# Patient Record
Sex: Male | Born: 1945 | Hispanic: No | Marital: Married
Health system: Southern US, Community
[De-identification: ages and names within clinical notes are randomized; demographics above are authoritative.]

---

## 2004-05-31 ENCOUNTER — Emergency Department: Payer: Self-pay | Admitting: Internal Medicine

## 2009-07-15 ENCOUNTER — Ambulatory Visit: Payer: Self-pay | Admitting: Cardiovascular Disease

## 2009-07-15 ENCOUNTER — Emergency Department: Payer: Self-pay | Admitting: Emergency Medicine

## 2009-07-22 ENCOUNTER — Ambulatory Visit: Payer: Self-pay | Admitting: Cardiovascular Disease

## 2009-07-22 DIAGNOSIS — R079 Chest pain, unspecified: Secondary | ICD-10-CM | POA: Insufficient documentation

## 2009-07-22 DIAGNOSIS — R9431 Abnormal electrocardiogram [ECG] [EKG]: Secondary | ICD-10-CM

## 2010-06-22 NOTE — Miscellaneous (Signed)
Summary: MV order  Clinical Lists Changes  Problems: Added new problem of CHEST PAIN UNSPECIFIED (ICD-786.50) Added new problem of ELECTROCARDIOGRAM, ABNORMAL (ICD-794.31) Orders: Added new Referral order of Nuclear Stress Test (Nuc Stress Test) - Signed

## 2011-03-01 ENCOUNTER — Emergency Department: Payer: Self-pay | Admitting: Internal Medicine

## 2011-11-21 ENCOUNTER — Ambulatory Visit: Payer: Self-pay | Admitting: Radiation Oncology

## 2011-12-22 ENCOUNTER — Ambulatory Visit: Payer: Self-pay | Admitting: Radiation Oncology

## 2012-01-22 ENCOUNTER — Ambulatory Visit: Payer: Self-pay | Admitting: Radiation Oncology

## 2012-02-07 LAB — CBC CANCER CENTER
Basophil %: 1 %
Eosinophil %: 2.1 %
HCT: 39.9 % — ABNORMAL LOW (ref 40.0–52.0)
HGB: 13.1 g/dL (ref 13.0–18.0)
Lymphocyte %: 19.8 %
MCHC: 32.9 g/dL (ref 32.0–36.0)
Monocyte %: 18.5 %
Neutrophil #: 2 x10 3/mm (ref 1.4–6.5)
Neutrophil %: 58.6 %
RBC: 4.35 10*6/uL — ABNORMAL LOW (ref 4.40–5.90)

## 2012-02-13 LAB — CBC CANCER CENTER
Basophil #: 0 x10 3/mm (ref 0.0–0.1)
Eosinophil #: 0.2 x10 3/mm (ref 0.0–0.7)
MCH: 30.3 pg (ref 26.0–34.0)
MCHC: 33.3 g/dL (ref 32.0–36.0)
Monocyte %: 15.6 %
Neutrophil %: 67.2 %
Platelet: 204 x10 3/mm (ref 150–440)
RDW: 13.1 % (ref 11.5–14.5)

## 2012-02-20 LAB — CBC CANCER CENTER
Eosinophil %: 6 %
HGB: 12.4 g/dL — ABNORMAL LOW (ref 13.0–18.0)
Monocyte %: 14.4 %
Neutrophil %: 63.4 %
Platelet: 204 x10 3/mm (ref 150–440)
RBC: 4.1 10*6/uL — ABNORMAL LOW (ref 4.40–5.90)
WBC: 3.1 x10 3/mm — ABNORMAL LOW (ref 3.8–10.6)

## 2012-02-21 ENCOUNTER — Ambulatory Visit: Payer: Self-pay | Admitting: Radiation Oncology

## 2012-02-27 LAB — CBC CANCER CENTER
Basophil #: 0 x10 3/mm (ref 0.0–0.1)
Eosinophil #: 0.3 x10 3/mm (ref 0.0–0.7)
Eosinophil %: 6.1 %
HCT: 36.5 % — ABNORMAL LOW (ref 40.0–52.0)
HGB: 12 g/dL — ABNORMAL LOW (ref 13.0–18.0)
MCH: 30.4 pg (ref 26.0–34.0)
MCV: 93 fL (ref 80–100)
Monocyte #: 0.6 x10 3/mm (ref 0.2–1.0)
Neutrophil %: 73.7 %
Platelet: 213 x10 3/mm (ref 150–440)
RDW: 13.9 % (ref 11.5–14.5)

## 2012-03-06 LAB — CBC CANCER CENTER
Eosinophil %: 8.1 %
HCT: 38.1 % — ABNORMAL LOW (ref 40.0–52.0)
HGB: 12.6 g/dL — ABNORMAL LOW (ref 13.0–18.0)
Lymphocyte #: 0.3 x10 3/mm — ABNORMAL LOW (ref 1.0–3.6)
Lymphocyte %: 9.4 %
MCH: 30.9 pg (ref 26.0–34.0)
MCV: 93 fL (ref 80–100)
Monocyte %: 14.2 %
Neutrophil #: 2 x10 3/mm (ref 1.4–6.5)
Platelet: 222 x10 3/mm (ref 150–440)
RBC: 4.08 10*6/uL — ABNORMAL LOW (ref 4.40–5.90)
WBC: 3 x10 3/mm — ABNORMAL LOW (ref 3.8–10.6)

## 2012-03-12 LAB — CBC CANCER CENTER
Basophil %: 0.6 %
Eosinophil %: 5.4 %
HGB: 13.1 g/dL (ref 13.0–18.0)
Lymphocyte #: 0.2 x10 3/mm — ABNORMAL LOW (ref 1.0–3.6)
MCH: 31.1 pg (ref 26.0–34.0)
MCV: 95 fL (ref 80–100)
Monocyte #: 0.5 x10 3/mm (ref 0.2–1.0)
RBC: 4.21 10*6/uL — ABNORMAL LOW (ref 4.40–5.90)
WBC: 3.7 x10 3/mm — ABNORMAL LOW (ref 3.8–10.6)

## 2012-03-19 LAB — CBC CANCER CENTER
Basophil #: 0 x10 3/mm (ref 0.0–0.1)
Eosinophil %: 4.9 %
HCT: 37.6 % — ABNORMAL LOW (ref 40.0–52.0)
HGB: 12.2 g/dL — ABNORMAL LOW (ref 13.0–18.0)
Lymphocyte #: 0.2 x10 3/mm — ABNORMAL LOW (ref 1.0–3.6)
Lymphocyte %: 7.6 %
MCV: 95 fL (ref 80–100)
Monocyte %: 15.1 %
Neutrophil #: 1.9 x10 3/mm (ref 1.4–6.5)
RBC: 3.98 10*6/uL — ABNORMAL LOW (ref 4.40–5.90)
RDW: 13.8 % (ref 11.5–14.5)
WBC: 2.7 x10 3/mm — ABNORMAL LOW (ref 3.8–10.6)

## 2012-03-23 ENCOUNTER — Ambulatory Visit: Payer: Self-pay | Admitting: Radiation Oncology

## 2012-03-26 LAB — CBC CANCER CENTER
Basophil #: 0 x10 3/mm (ref 0.0–0.1)
HCT: 38.5 % — ABNORMAL LOW (ref 40.0–52.0)
Lymphocyte #: 0.2 x10 3/mm — ABNORMAL LOW (ref 1.0–3.6)
MCH: 31.1 pg (ref 26.0–34.0)
MCHC: 32.8 g/dL (ref 32.0–36.0)
MCV: 95 fL (ref 80–100)
Monocyte #: 0.4 x10 3/mm (ref 0.2–1.0)
Monocyte %: 12.1 %
Neutrophil #: 2.8 x10 3/mm (ref 1.4–6.5)
Platelet: 238 x10 3/mm (ref 150–440)
RDW: 13.8 % (ref 11.5–14.5)
WBC: 3.6 x10 3/mm — ABNORMAL LOW (ref 3.8–10.6)

## 2012-04-22 ENCOUNTER — Ambulatory Visit: Payer: Self-pay | Admitting: Radiation Oncology

## 2012-05-23 ENCOUNTER — Ambulatory Visit: Payer: Self-pay | Admitting: Radiation Oncology

## 2012-08-27 ENCOUNTER — Ambulatory Visit: Payer: Self-pay | Admitting: Radiation Oncology

## 2012-09-20 ENCOUNTER — Ambulatory Visit: Payer: Self-pay | Admitting: Radiation Oncology

## 2013-02-26 ENCOUNTER — Ambulatory Visit: Payer: Self-pay | Admitting: Radiation Oncology

## 2013-02-27 ENCOUNTER — Ambulatory Visit: Payer: Self-pay | Admitting: Radiation Oncology

## 2013-02-28 LAB — PSA: PSA: 0.7 ng/mL (ref 0.0–4.0)

## 2013-03-22 ENCOUNTER — Inpatient Hospital Stay: Payer: Self-pay | Admitting: Internal Medicine

## 2013-03-22 LAB — PROTIME-INR
INR: 1
Prothrombin Time: 13.8 secs (ref 11.5–14.7)

## 2013-03-22 LAB — URINALYSIS, COMPLETE
Bacteria: NONE SEEN
Bacteria: NONE SEEN
Bilirubin,UR: NEGATIVE
Glucose,UR: NEGATIVE mg/dL (ref 0–75)
Hyaline Cast: 9
Ketone: NEGATIVE
Leukocyte Esterase: NEGATIVE
Leukocyte Esterase: NEGATIVE
Nitrite: NEGATIVE
Ph: 5 (ref 4.5–8.0)
Protein: 30
RBC,UR: 5 /HPF (ref 0–5)
RBC,UR: 2 /HPF (ref 0–5)
Specific Gravity: 1.021 (ref 1.003–1.030)
Specific Gravity: 1.046 (ref 1.003–1.030)
Squamous Epithelial: 2
Squamous Epithelial: 1
WBC UR: 6 /HPF (ref 0–5)
WBC UR: 1 /HPF (ref 0–5)

## 2013-03-22 LAB — COMPREHENSIVE METABOLIC PANEL
Alkaline Phosphatase: 255 U/L — ABNORMAL HIGH (ref 50–136)
BUN: 31 mg/dL — ABNORMAL HIGH (ref 7–18)
Bilirubin,Total: 1.5 mg/dL — ABNORMAL HIGH (ref 0.2–1.0)
Calcium, Total: 9.2 mg/dL (ref 8.5–10.1)
Creatinine: 1.27 mg/dL (ref 0.60–1.30)
EGFR (Non-African Amer.): 57 — ABNORMAL LOW
Glucose: 144 mg/dL — ABNORMAL HIGH (ref 65–99)
Osmolality: 279 (ref 275–301)
Potassium: 4.5 mmol/L (ref 3.5–5.1)
SGOT(AST): 108 U/L — ABNORMAL HIGH (ref 15–37)
SGPT (ALT): 42 U/L (ref 12–78)
Sodium: 135 mmol/L — ABNORMAL LOW (ref 136–145)

## 2013-03-22 LAB — DIFFERENTIAL
Bands: 3 %
Lymphocytes: 32 %
Monocytes: 10 %
Segmented Neutrophils: 55 %

## 2013-03-22 LAB — CBC
HGB: 6.4 g/dL — ABNORMAL LOW (ref 13.0–18.0)
MCHC: 34.9 g/dL (ref 32.0–36.0)
MCV: 86 fL (ref 80–100)
Platelet: 30 10*3/uL — CL (ref 150–440)
RBC: 2.14 10*6/uL — ABNORMAL LOW (ref 4.40–5.90)

## 2013-03-23 ENCOUNTER — Ambulatory Visit: Payer: Self-pay | Admitting: Radiation Oncology

## 2013-03-23 LAB — MAGNESIUM: Magnesium: 1.6 mg/dL — ABNORMAL LOW

## 2013-03-23 LAB — CBC WITH DIFFERENTIAL/PLATELET
HGB: 7.2 g/dL — ABNORMAL LOW (ref 13.0–18.0)
Lymphocytes: 37 %
MCH: 29.3 pg (ref 26.0–34.0)
MCV: 83 fL (ref 80–100)
Monocytes: 4 %
RDW: 14.9 % — ABNORMAL HIGH (ref 11.5–14.5)
Segmented Neutrophils: 59 %
WBC: 0.6 10*3/uL — CL (ref 3.8–10.6)

## 2013-03-23 LAB — LIPID PANEL
HDL Cholesterol: 12 mg/dL — ABNORMAL LOW (ref 40–60)
Ldl Cholesterol, Calc: 56 mg/dL (ref 0–100)
Triglycerides: 284 mg/dL — ABNORMAL HIGH (ref 0–200)
VLDL Cholesterol, Calc: 57 mg/dL — ABNORMAL HIGH (ref 5–40)

## 2013-03-23 LAB — COMPREHENSIVE METABOLIC PANEL
BUN: 26 mg/dL — ABNORMAL HIGH (ref 7–18)
Chloride: 107 mmol/L (ref 98–107)
Co2: 24 mmol/L (ref 21–32)
EGFR (African American): >60
Total Protein: 5.1 g/dL — ABNORMAL LOW (ref 6.4–8.2)

## 2013-03-23 LAB — HEMOGLOBIN: HGB: 7.6 g/dL — ABNORMAL LOW (ref 13.0–18.0)

## 2013-03-24 LAB — CBC WITH DIFFERENTIAL/PLATELET
Basophil #: 0 10*3/uL (ref 0.0–0.1)
Basophil %: 1 %
HCT: 20.6 % — ABNORMAL LOW (ref 40.0–52.0)
HGB: 7.6 g/dL — ABNORMAL LOW (ref 13.0–18.0)
Lymphocyte #: 0.1 10*3/uL — ABNORMAL LOW (ref 1.0–3.6)
Lymphocyte %: 39.2 %
MCH: 30.4 pg (ref 26.0–34.0)
Monocyte #: 0 x10 3/mm — ABNORMAL LOW (ref 0.2–1.0)
Monocyte %: 12.4 %
Neutrophil #: 0.2 10*3/uL — ABNORMAL LOW (ref 1.4–6.5)
Neutrophil %: 46.9 %
Platelet: 18 10*3/uL — CL (ref 150–440)
RDW: 14.9 % — ABNORMAL HIGH (ref 11.5–14.5)
WBC: 0.4 10*3/uL — CL (ref 3.8–10.6)

## 2013-03-24 LAB — HEMOGLOBIN: HGB: 7.7 g/dL — ABNORMAL LOW (ref 13.0–18.0)

## 2013-03-24 LAB — MAGNESIUM: Magnesium: 1.8 mg/dL

## 2013-03-24 LAB — PLATELET COUNT: Platelet: 18 10*3/uL — CL (ref 150–440)

## 2013-03-25 LAB — CBC WITH DIFFERENTIAL/PLATELET
Basophil #: 0 10*3/uL (ref 0.0–0.1)
Eosinophil #: 0 10*3/uL (ref 0.0–0.7)
Eosinophil %: 0.3 %
HCT: 18.9 % — ABNORMAL LOW (ref 40.0–52.0)
HGB: 6.9 g/dL — ABNORMAL LOW (ref 13.0–18.0)
Lymphocytes: 19 %
MCH: 29.9 pg (ref 26.0–34.0)
MCV: 82 fL (ref 80–100)
Monocyte %: 9.4 %
Monocytes: 9 %
Segmented Neutrophils: 61 %

## 2013-03-25 LAB — VANCOMYCIN, TROUGH: Vancomycin, Trough: 30 ug/mL (ref 10–20)

## 2013-03-25 LAB — CREATININE, SERUM: EGFR (African American): >60

## 2013-03-26 LAB — CBC WITH DIFFERENTIAL/PLATELET
Basophil #: 0 10*3/uL (ref 0.0–0.1)
Basophil %: 1.3 %
Eosinophil %: 0.9 %
HCT: 22.9 % — ABNORMAL LOW (ref 40.0–52.0)
Lymphocyte %: 33.2 %
MCH: 29.7 pg (ref 26.0–34.0)
Monocyte %: 10.7 %
Neutrophil #: 0.4 10*3/uL — ABNORMAL LOW (ref 1.4–6.5)
Neutrophil %: 53.9 %
Platelet: 24 10*3/uL — CL (ref 150–440)
RDW: 15.1 % — ABNORMAL HIGH (ref 11.5–14.5)
WBC: 0.7 10*3/uL — CL (ref 3.8–10.6)

## 2013-03-26 LAB — BASIC METABOLIC PANEL
Anion Gap: 6 — ABNORMAL LOW (ref 7–16)
Calcium, Total: 9.1 mg/dL (ref 8.5–10.1)
Chloride: 102 mmol/L (ref 98–107)
Co2: 25 mmol/L (ref 21–32)
Creatinine: 1.16 mg/dL (ref 0.60–1.30)
EGFR (African American): >60
Glucose: 86 mg/dL (ref 65–99)
Osmolality: 273 (ref 275–301)
Potassium: 4.2 mmol/L (ref 3.5–5.1)
Sodium: 133 mmol/L — ABNORMAL LOW (ref 136–145)

## 2013-03-27 LAB — CULTURE, BLOOD (SINGLE)

## 2013-03-27 LAB — CBC WITH DIFFERENTIAL/PLATELET
Basophil #: 0 10*3/uL (ref 0.0–0.1)
Eosinophil #: 0 10*3/uL (ref 0.0–0.7)
Eosinophil %: 1.1 %
HCT: 22 % — ABNORMAL LOW (ref 40.0–52.0)
Lymphocyte #: 0.2 10*3/uL — ABNORMAL LOW (ref 1.0–3.6)
Lymphocyte %: 38.2 %
Monocyte #: 0.1 x10 3/mm — ABNORMAL LOW (ref 0.2–1.0)
Monocyte %: 10.4 %
Neutrophil #: 0.2 10*3/uL — ABNORMAL LOW (ref 1.4–6.5)
Platelet: 13 10*3/uL — CL (ref 150–440)
RBC: 2.68 10*6/uL — ABNORMAL LOW (ref 4.40–5.90)
WBC: 0.5 10*3/uL — CL (ref 3.8–10.6)

## 2013-03-29 ENCOUNTER — Ambulatory Visit: Payer: Self-pay | Admitting: Radiation Oncology

## 2013-04-22 DEATH — deceased

## 2013-08-27 ENCOUNTER — Ambulatory Visit: Payer: Self-pay | Admitting: Radiation Oncology

## 2014-03-23 IMAGING — CR DG CHEST 2V
1 series · 2 of 2 positions shown · non-contrast
Comparison: none

<!--  IDXRADR:ADDEND:BEGIN -->Addendum Begins
REASON FOR EXAM: shortness of breath
COMMENTS:

PROCEDURE:     DXR - DXR CHEST PA (OR AP) AND LATERAL  - March 22, 2013  [DATE]
RESULT:

[Series 1: pa · 0.17mm/px · 2 of 2 slices shown]
[im 1/2]
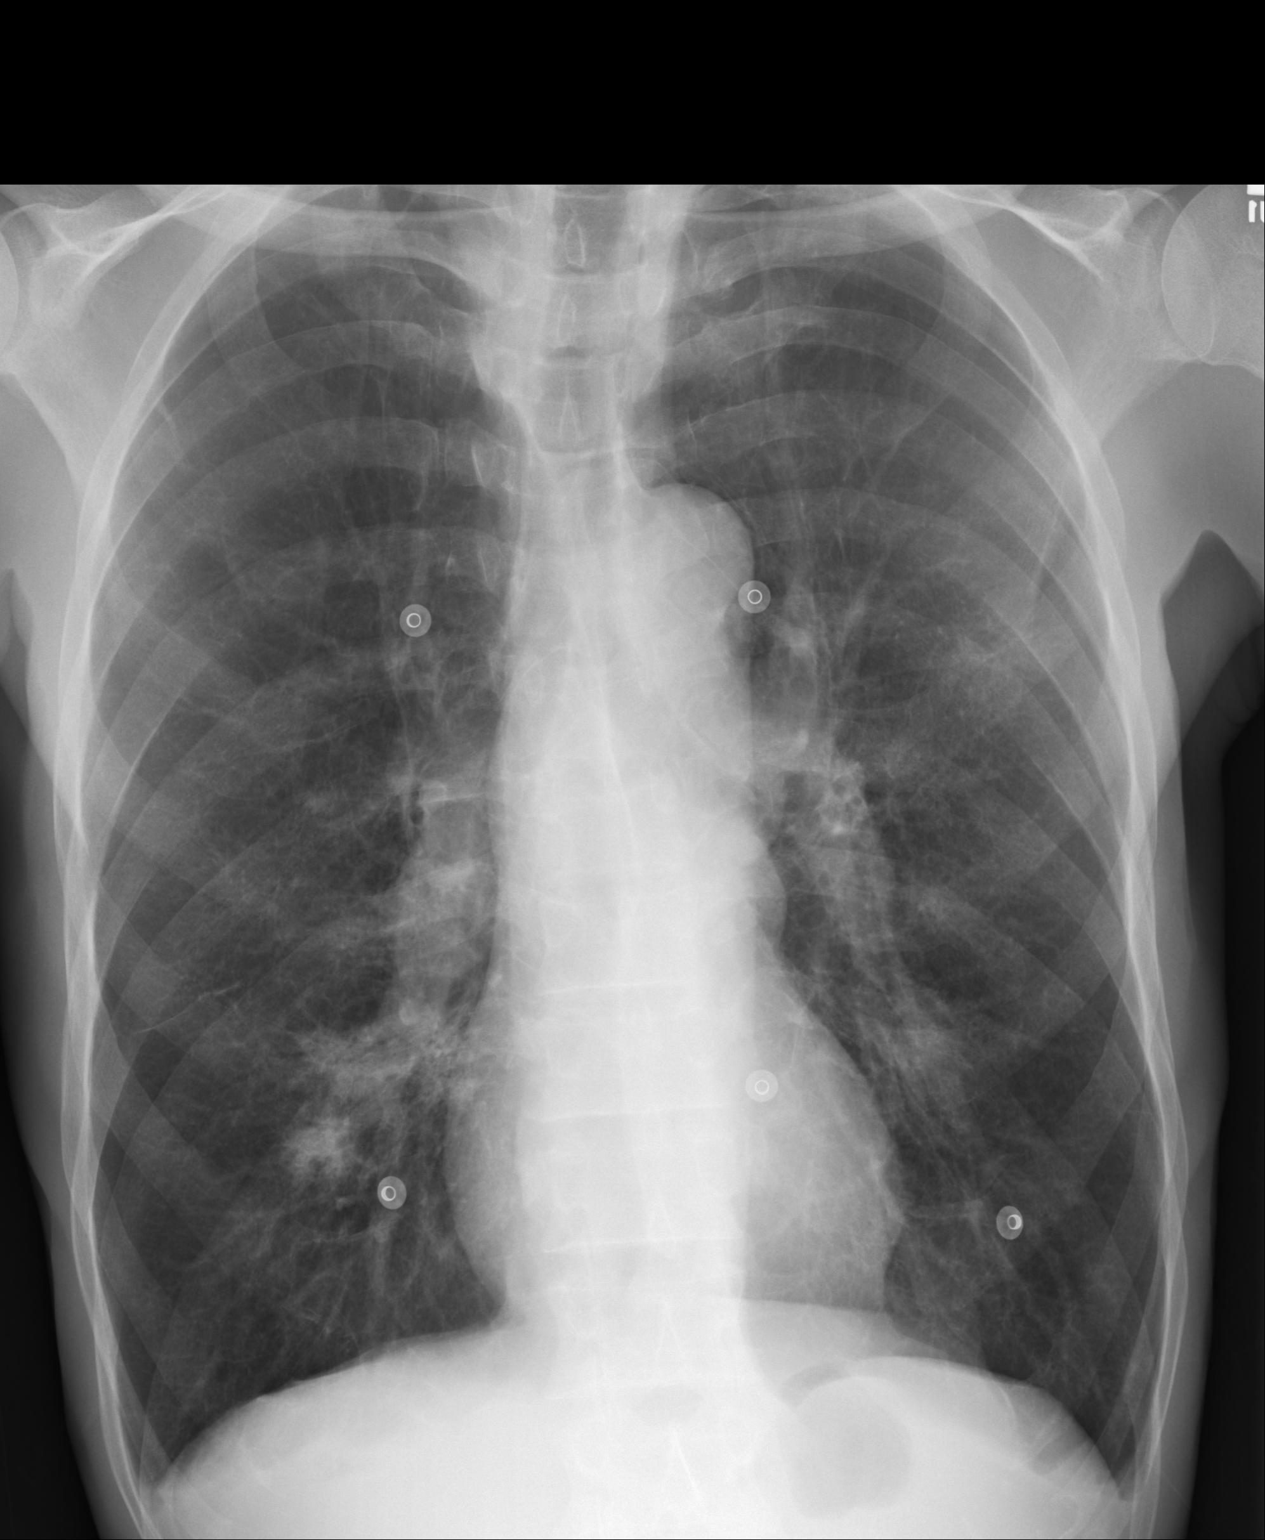
[im 2/2]
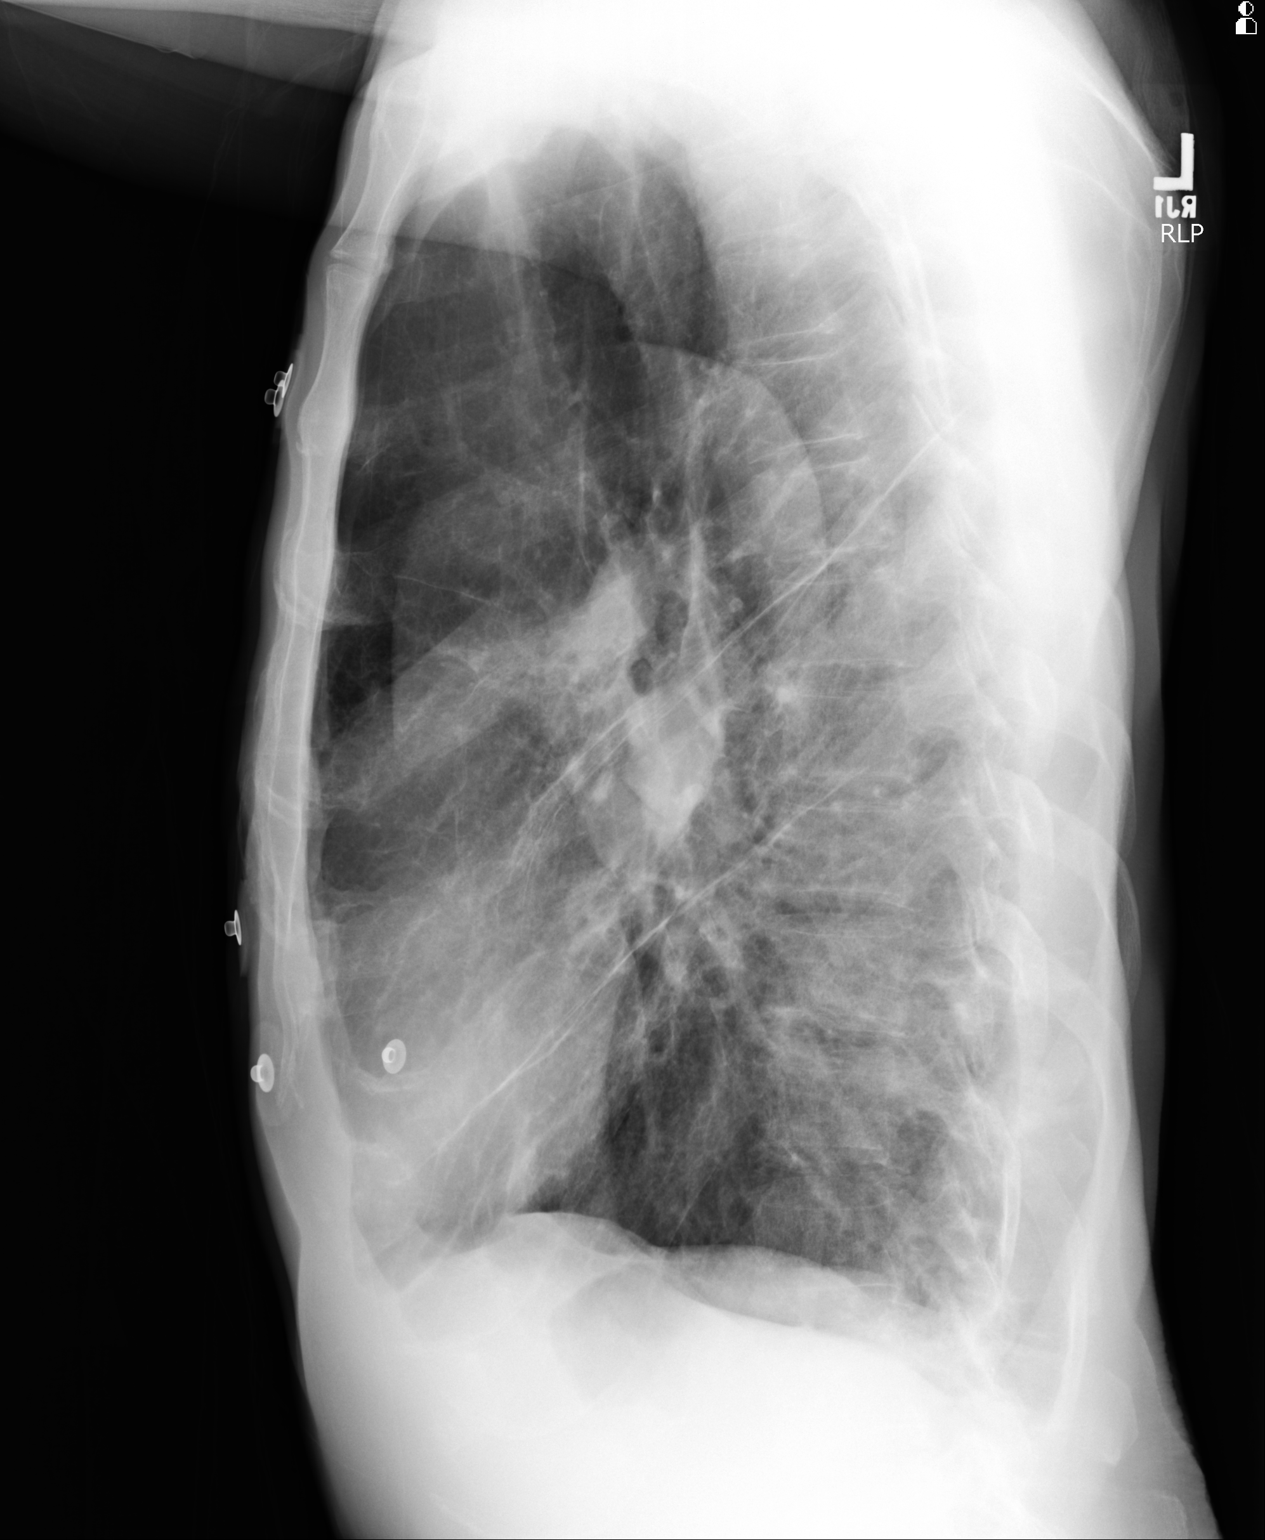

[2 of 2 positions shown; findings below may reference images not displayed]

FINDINGS: The lungs are hyperinflated. Spiculated area of increased density
projects within the right middle lobe region. This finding is appreciated
primarily on the frontal view. This finding appears to have developed in the
interim. Further evaluation with contrasted chest CT is recommended. The
cardiac silhouette and visualized bony skeleton is unremarkable.
IMPRESSION: 1.  COPD.
2.  Findings concerning for a spiculated nodule within the right middle
lobe.
3.  No further evidence of focal or acute infiltrates or consolidation.

<!--  IDXRADR:ADDEND:INNER_END -->Addendum Ends
<!--  IDXRADR:ADDEND:END -->

## 2014-03-23 IMAGING — CT CT CHEST-ABD-PELV W/ CM
3 of 4 series · 13 of 32 positions shown, 18 images · IV contrast (APPLIED)
Comparison: none

REASON FOR EXAM: (1) shortness of breath new lesions on cxr. eval PE; (2)
abd pain early satiety
COMMENTS:

[Series 6: soft tissue · axial · 0.74mm/px · z∈[-244,-124]mm · 2 of 120 slices shown]
[im 40/120  soft-tissue]
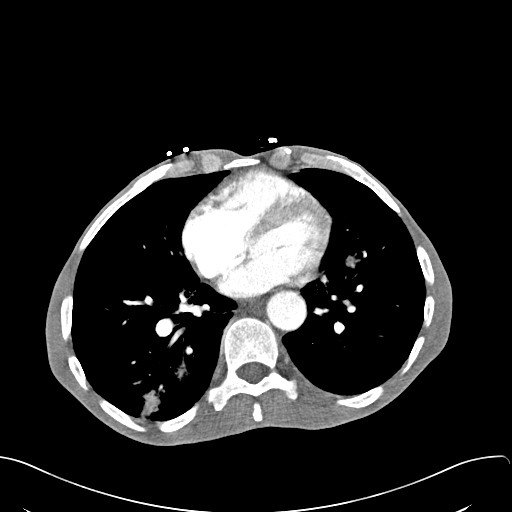
[im 80/120  soft-tissue]
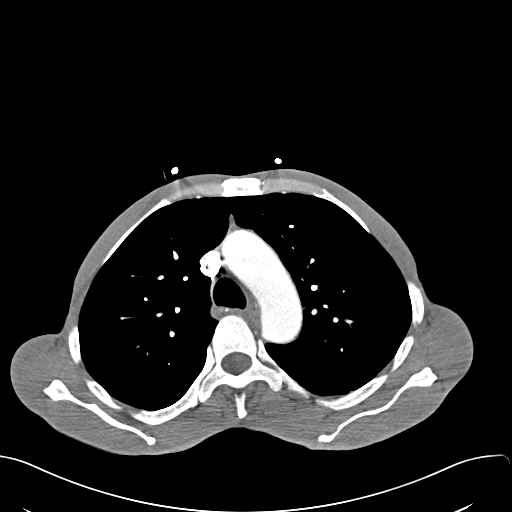

[Series 8: lung_cad · axial · 0.74mm/px · z∈[-328,-78]mm · 7 of 522 slices shown]
[im 55/522  soft-tissue]
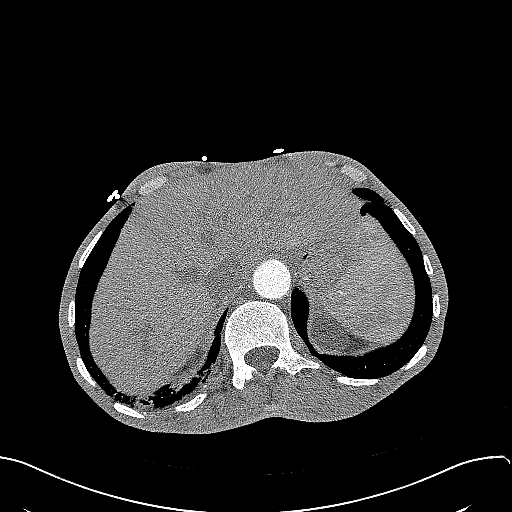
[im 110/522  soft-tissue]
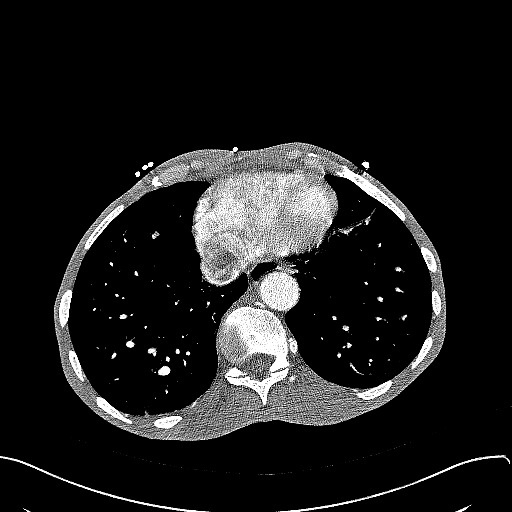
[im 165/522  soft-tissue]
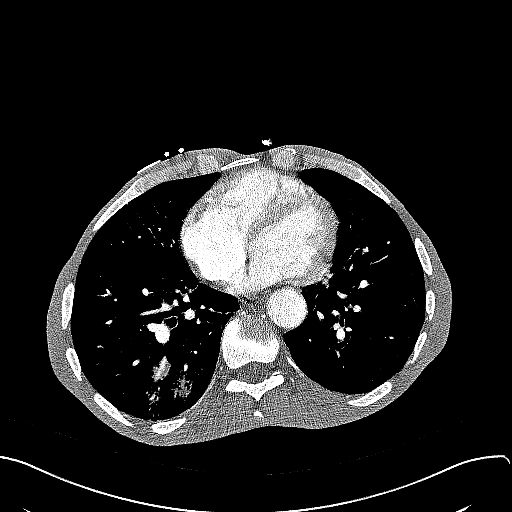
[im 220/522  soft-tissue]
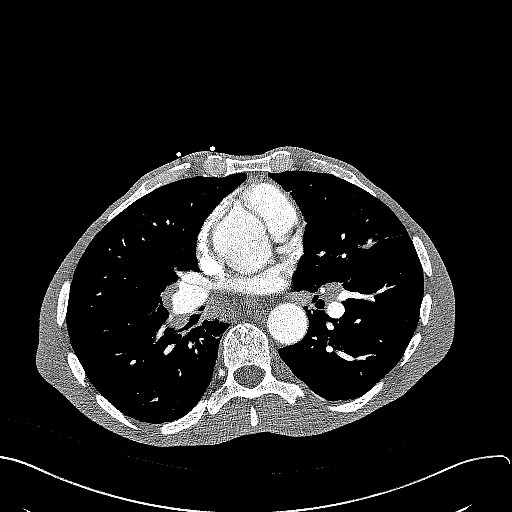
[im 302/522  soft-tissue]
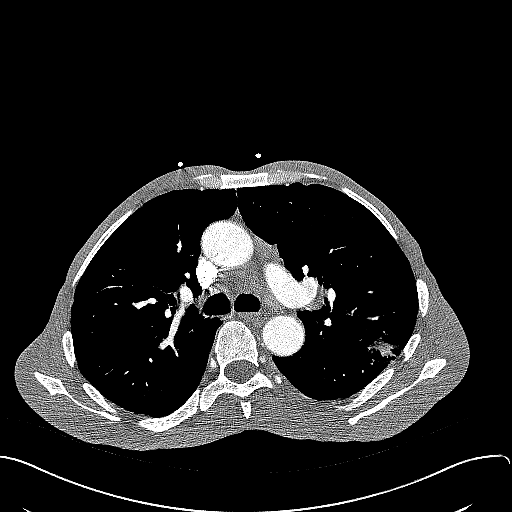
[im 357/522  soft-tissue]
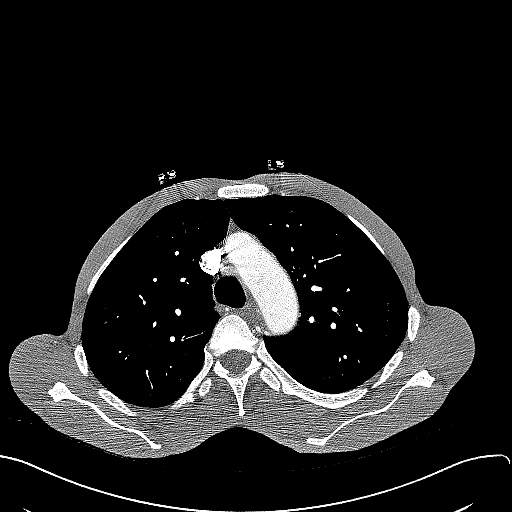
[im 412/522  soft-tissue]
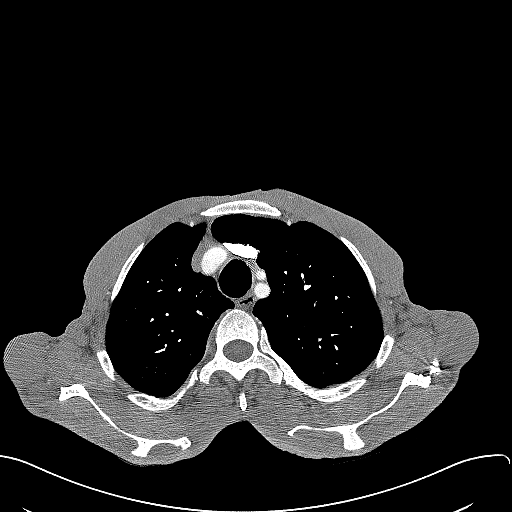

[Series 10: abdomen · axial · 0.74mm/px · z∈[-634,-370]mm · 4 of 148 slices shown, 9 images]
[im 30/148  soft-tissue]
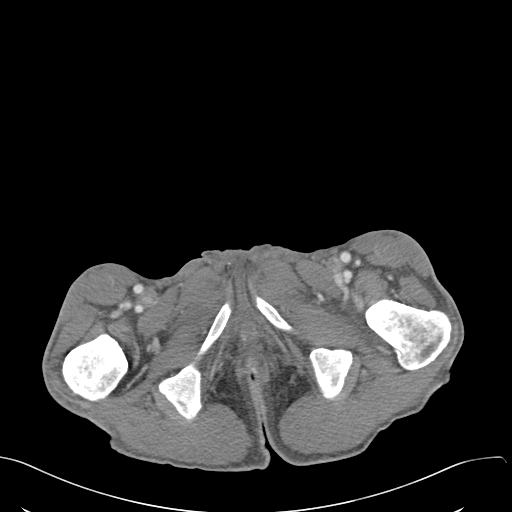
[im 30/148  lung]
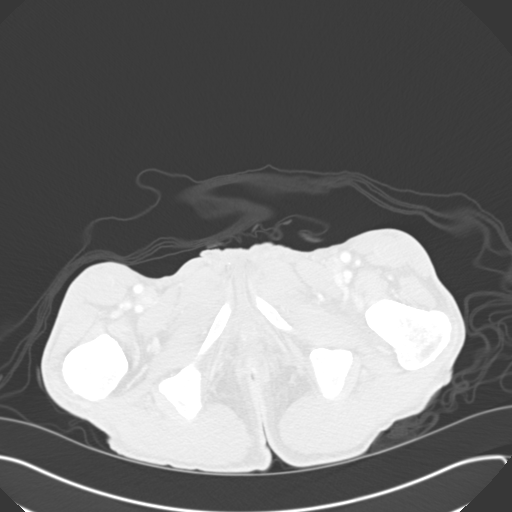
[im 30/148  bone]
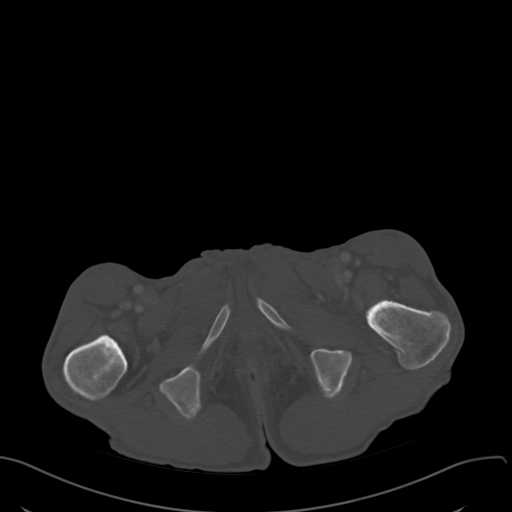
[im 59/148  soft-tissue]
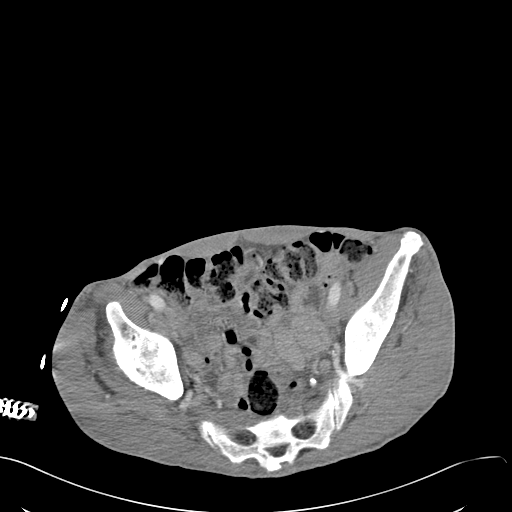
[im 59/148  lung]
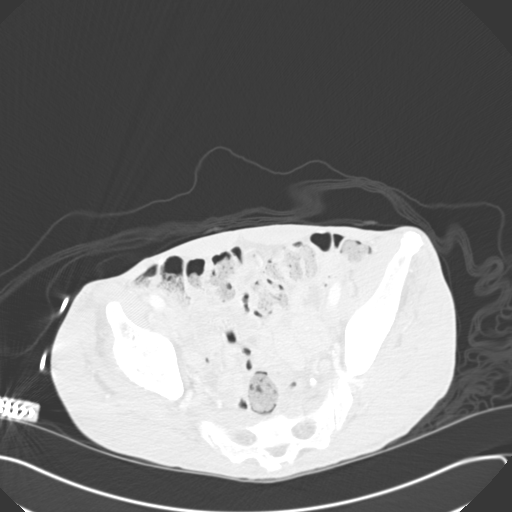
[im 89/148  soft-tissue]
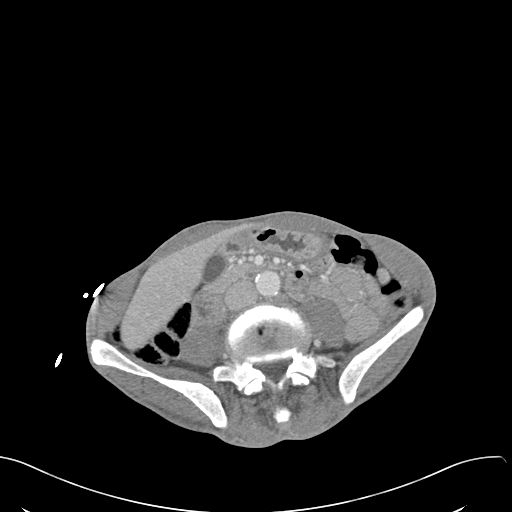
[im 89/148  lung]
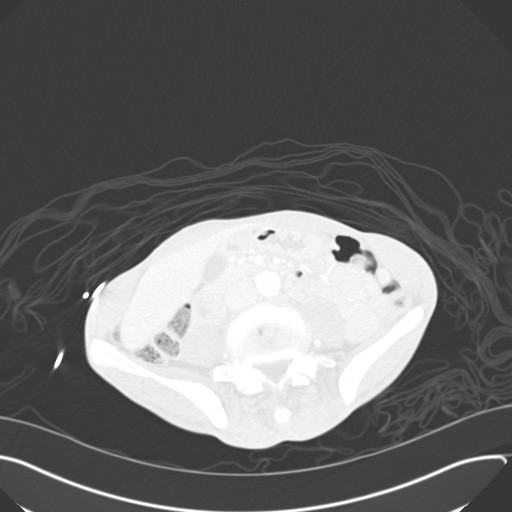
[im 118/148  soft-tissue]
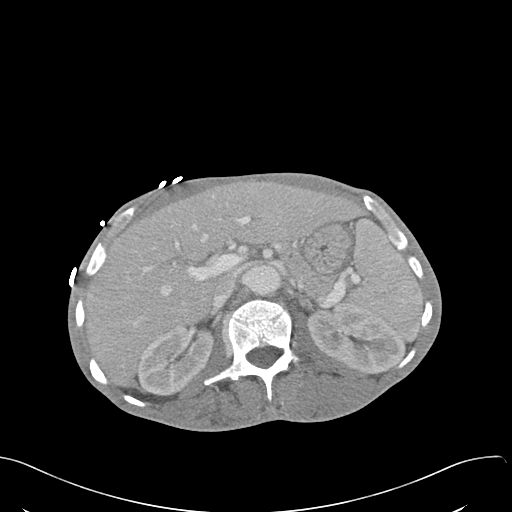
[im 118/148  lung]
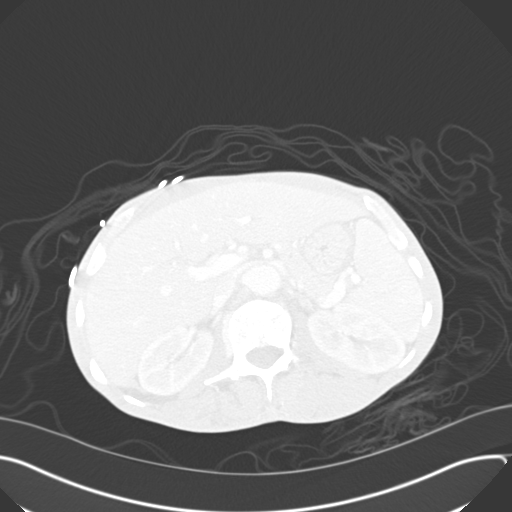

[13 of 32 positions shown; findings below may reference images not displayed]

PROCEDURE:     CT  - CT CHEST ABDOMEN AND PELVIS W  - March 22, 2013  [DATE]

RESULT:     CT of the chest, abdomen and pelvis is performed utilizing 100
mL of Vsovue-4T2 iodinated intravenous contrast without oral contrast.
Images are reconstructed in the axial plane at 3 mm slice thickness
increments. The patient has no previous exam for comparison.

Lung window images show severe chronic emphysematous lung disease with
bullous areas. There is a grouping of prominent irregular nodules or masses
in the right lower lobe. The largest is posteriorly on image 89 and measures
up to approximately 1.89 cm anterior to posterior x 1.77 cm transversely.
There is a similar area in the superior segment of the left lower lobe
abutting the fissure best appreciated in the area of image 52 showing an
anterior-posterior dimension of 1.57 cm with a transverse dimension of
cm. The cluster in the right lower lobe shows at least 4 lesions and
possibly 5. There is thickening in the lingula and right middle lobe regions
which is likely fibrotic or atelectatic. There is respiratory motion
artifact present area there are additional less well-defined areas of
density in both lungs. Apical fibrotic changes are present. The thoracic
aorta is normal in caliber without dissection. The pulmonary arteries
demonstrate no definite pulmonary arterial filling defect to suggest
pulmonary embolism. Some of the segmental and subsegmental branches are
poorly seen because of the respiratory motion artifact. There is bilateral
hilar adenopathy and subcarinal adenopathy as well is AP window and
pretracheal and precarinal adenopathy. Findings are concerning for
underlying pulmonary malignancy with metastatic disease. Other primary
etiologies or not excluded.

There are stones in the dependent portion of the urinary bladder. The
prostate is enlarged and contains multiple calcific densities. Correlate
with digital rectal exam and PSA. There is no evidence of abnormal bowel
distention or definite bowel wall thickening. The liver shows no discrete
mass. The spleen is unremarkable. There are cystic areas in the left kidney.
The largest appears to be at the upper pole of the left kidney measuring up
to 4.18 cm. There may be a tiny stone in the left renal collecting system on
image 33 measuring approximately 2.5 mm the second on image 39. These are
nonobstructing.
IMPRESSION: 1. Findings concerning for pulmonary malignancy with pulmonary and
mediastinal as well as hilar metastasis.
2. Nonobstructing left renal stones.
3. Left renal cysts.
4. Prostate enlargement with calcification. Correlate with PSA and digital
rectal exam.
5. Bladder calculi.
6. Extensive emphysematous only disease with fibrosis and bullous changes.

[REDACTED]

## 2014-09-12 NOTE — Discharge Summary (Signed)
PATIENT NAME:  Brent Peters, Brent Peters MR#:  [id number] DATE OF BIRTH:  [birth year]  DATE OF ADMISSION:  03/22/2013 DATE OF DISCHARGE:  03/27/2013   Please refer to history and physical dictated by Dr. James Ivanoff and oncology consult dictated by Dr. Inez Pilgrim for further details.   DISCHARGE DIAGNOSES:  1. Neutropenic fever.  2. Pancytopenia.  3. Chronic obstructive pulmonary disease exacerbation.  4. Hypomagnesemia.  5. History of prostate cancer.  6. Benign prostatic hypertrophy.  7. Hypotension.  8. Hyponatremia.   HISTORY OF PRESENT ILLNESS: This is a 69 year old male who presented with shortness of breath due to COPD exacerbation. As well, the patient was found to have pancytopenia and evidence of a new lung mass. The patient was neutropenic. Did have a fever despite being on broad-spectrum IV antibiotic coverage. The patient has been on cefepime and vancomycin. Has been spiking temperature almost on a daily basis. Micafungin was added and despite that, the patient still spiking temperature. As well, the patient is on Neupogen 480 mcg daily. As well, the patient was found to have pancytopenia with a new lung mass. The patient had bone marrow biopsy on November 3rd with a suspicion for lymphoma as he had high LDH. The patient was seen by Dr. Inez Pilgrim this evening from the oncology service who arranged for the patient to be transferred to [facility] under Dr. Thera Flake of the hem/onc service. The patient had lymphadenopathy with pulmonary lesions, pancytopenia, also still febrile on broad-spectrum antibiotics and antifungal. His HIV is negative. Hep B is negative. Hep C is negative. Coombs negative. With known history of prostate cancer with radiation treatment 1 year ago. 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 Please refer to Dr. Marylene Land note on November 5th for further details regarding his biopsy, where Dr. Inez Pilgrim requested a transfer to obtain a second  opinion at the [facility] with lymphoma experts plus at a facility with infectious disease service.   PERTINENT LABORATORIES: BMP as of November 4th showing glucose 86, BUN 33, creatinine 1.16, sodium 133, potassium 4.2, chloride 102, CO2 of 25. White blood cells 0.5, hemoglobin 7.9, hematocrit 22 and platelets of 13.   PHYSICAL EXAMINATION:  VITAL SIGNS: Temperature 99.7, pulse 97, respiratory rate 18, blood pressure 91/57, saturating 93% on oxygen.  GENERAL: Elderly cachectic male, looks comfortable in bed, in no apparent distress.  HEENT: Head atraumatic, normocephalic. Pupils equal, reactive to light. Pink conjunctivae. Anicteric sclerae. Dry oral mucosa.  NECK: Supple. No thyromegaly. No JVD.  CHEST: Good air entry bilaterally. Clear to auscultation.  CARDIOVASCULAR: S1, S2 heard. No rubs, murmurs or gallops.  ABDOMEN: Soft, nontender, nondistended. Bowel sounds present.  EXTREMITIES: No edema. No clubbing. No cyanosis.  SKIN: Normal skin turgor. Warm and dry.  NEUROLOGIC: Cranial nerves grossly intact. No focal deficits.  PSYCHIATRIC: Appropriate affect. Awake, alert x 2.  LYMPHATICS: Positive for lymphadenopathy in the neck.   CURRENT MEDICATIONS IN HOSPITAL:  1. Tylenol 650 every 4 hours as needed.  2. Colace 100 mg b.i.d. as needed.  3. Morphine 2 to 4 mg IV every 4 hours as needed.  4. Zofran 4 mg IV every 4 hours as needed.  5. Zantac 150 mg every 12 hours.  6. Senna 1 tablet twice a day as needed.  7. Magnesium oxide 400 mg oral twice a day.  8. Albuterol/ipratropium 1 puff inhalational 4 times a day.  9. Neupogen 480 mcg subcutaneous daily.  10. Symbicort 160/4.5 two puffs b.i.d.  11. Cefepime  2 g IV every 12 hours.  12. Vancomycin 1 g every 12 hours.   CODE STATUS: The patient is FULL CODE.   Discussed with the patient and wife at bedside. All of their questions were answered.   TOTAL TIME SPENT ON DISCHARGE SUMMARY: 35 minutes.    ____________________________ Albertine Patricia, MD dse:gb D: 03/27/2013 21:58:31 ET T: 03/27/2013 23:16:13 ET JOB#: [id number]  cc: Albertine Patricia, MD, <Dictator> Tayli Buch Graciela Husbands MD ELECTRONICALLY SIGNED 03/29/2013 4:08

## 2014-09-12 NOTE — Consult Note (Signed)
Chief Complaint:  Subjective/Chief Complaint NO ACUTE COMPLAINTS   VITAL SIGNS/ANCILLARY NOTES: **Vital Signs.:   02-Nov-14 16:00  Vital Signs Type Q 4hr  Temperature Temperature (F) 98.5  Celsius 36.9  Temperature Source oral  Pulse Pulse 88  Respirations Respirations 20  Systolic BP Systolic BP 102  Diastolic BP (mmHg) Diastolic BP (mmHg) 68  Mean BP 79  Pulse Ox % Pulse Ox % 95  Pulse Ox Activity Level  At rest  Oxygen Delivery 2L   Brief Assessment:  GEN well developed   Cardiac Regular   Respiratory normal resp effort   Gastrointestinal details normal Soft  Nontender   Additional Physical Exam NEURO GROSSLY NON FOCAL. LYMPH NODES PALPABLE NECK AND AXILLA   Lab Results:  Routine Chem:  02-Nov-14 06:15   Result Comment WBC - RESULTS VERIFIED BY REPEAT TESTING.  - CRITICAL VALUE PREVIOUSLY NOTIFIED. PLATELET - VERIFIED BY SMEAR ESTIMATE  - CRITICAL VALUE PREVIOUSLY NOTIFIED. CBC - SMEAR SCANNED  Result(s) reported on 24 Mar 2013 at 07:39AM.  Uric Acid, Serum  2.9 (Result(s) reported on 24 Mar 2013 at 06:47AM.)  Magnesium, Serum 1.8 (1.8-2.4 THERAPEUTIC RANGE: 4-7 mg/dL TOXIC: > 10 mg/dL  -----------------------)    10:25   Result Comment Platelet - CRITICAL VALUE PREVIOUSLY NOTIFIED. Platelet - VERIFIED BY SMEAR ESTIMATE  Result(s) reported on 24 Mar 2013 at 10:39AM.  Routine Hem:  02-Nov-14 06:15   WBC (CBC)  0.4  RBC (CBC)  2.49  Hemoglobin (CBC)  7.6  Hematocrit (CBC)  20.6  Platelet Count (CBC)  18  MCV 83  MCH 30.4  MCHC  36.8  RDW  14.9  Neutrophil % 46.9  Lymphocyte % 39.2  Monocyte % 12.4  Eosinophil % 0.5  Basophil % 1.0  Neutrophil #  0.2  Lymphocyte #  0.1  Monocyte #  0.0  Eosinophil # 0.0  Basophil # 0.0    10:25   Hemoglobin (CBC)  7.7 (Result(s) reported on 24 Mar 2013 at 10:39AM.)  Platelet Count (CBC)  18   Assessment/Plan:  Assessment/Plan:  Assessment SEE ALSO INITIAL CONSULT....Marland Kitchen.NOTE PATIENT SEEN AND EVALUATED  AFTERNOON 11/2, AND PROPER TIME AND DATE ENTERED  FOR CONTINUITIY OF THE RECORD, ..ALTHOUGH THIS ENTRY ACTUALLY NOT TYPED UNTIL THE NEXT DAY.........ADENOPATHY, LUNG MASSES, PANCYTOPENIA, HIGH LDH,  SUSPICIOUS FOR LYMPHOMA WITH MARROW INVOLVEMENT, BUT OTHER PATHOLOGIES SUCH AS PRIMARY LUNG CANCER OR UNKNOWN PRIMARY CANCER, EVEN POSSIBLE LEUKEMIA , OR 2 PATHOLOGIES SUCH AS MDS PLUS CANCER ARE ALL POSSIBLE.   NOW ALSO HAS HAD FEVER, AND ALSO PLTS DOWN BELOW 20K   Plan AS DISCUSSED WITH DR Renae GlossWIETING, ALREADY STARTED BROAD SPECTRUM ANTIBIOTICS, PLUS NEUPOGEN, WILL REEVALUATE DAILY...ARRANGING BM EXAM FOR 11/3, DISCUSSED WITH PATIENT AND WIFE.Marland Kitchen.ALSO TRANSFUSED PLTS FOR FALLING COUNT AND BELOW 20K..............IRRADIATE ALL BLOOD PRODUCTS...   Electronic Signatures: Marin RobertsGittin, Robert G (MD)  (Signed [phone]-Nov-14 09:24)  Authored: Chief Complaint, VITAL SIGNS/ANCILLARY NOTES, Brief Assessment, Lab Results, Assessment/Plan   Last Updated: 03-Nov-14 09:24 by Marin RobertsGittin, Robert G (MD)

## 2014-09-12 NOTE — Consult Note (Signed)
Brief Consult Note: Diagnosis: PANCYTOPENIA, LUNG PARYNCHYMAL MASSES, LYMPADENOPATHY, PROSTATE CANCER,.   Patient was seen by consultant.   Comments: SEE DICTATED NOTE TO FOLLOW... CURRENTLY NO PAIN, NO SOB, GENERAL WEAKNESS, BETTER SINCE ADMISSION, COMPLETING 2 UNITS PRBC TRANSFUSION, NO FEVER OR CHILLS, RECENT APPETITE LOSS AND EARLY SATIETY, NO WT LOSS, ...MOST LIKELY LYMPHOMS WITH MARROW INVOLVEMENT, POSSIBLY PRIMARY LUNG CSNCER WITH NODAL AND MARROW INVOLVEMENT, PERIPHERSL ADENOPATHY AND HIGH LDH AND PANCYTOPENIA FAVOR LYMPHOMA....Marland Kitchen.PLAN  KEEP ON IV NS,, AFTER DYE STUDY, F/U CBC, LFT, CHECK HEPATITIS AND HIV SEROLOGIES AND URIC ACID, CHECK COOMBS TEST....WILL WANT TO PURSUE BONE MARROW EXAM, AND BX OF CERVICAL OR AXILLARY NODE, IF LYMPHOMA CONFIRMED WOULD GET PET SCAN LATER.  IF SPIKES FEVER WOULD CHANGE ANTIBIOTICS TO VANCO AND CEPHIPIME, AND ONLY IF FEVER SPIKE GIVE DAILY NEUPOGEN, WOULD NOT GIVE NEULASTA.  IF STABLE FOR DISCHARGE COULD PURSUE ALL WORKUP AS OUT PATIENT.  Electronic Signatures: Marin RobertsGittin, Ronelle Smallman G (MD)  (Signed 31-Oct-14 23:41)  Authored: Brief Consult Note   Last Updated: 31-Oct-14 23:41 by Marin RobertsGittin, Jann Ra G (MD)

## 2014-09-12 NOTE — H&P (Signed)
PATIENT NAME:  Brent Peters, Brent Peters MR#:  [id number] DATE OF BIRTH:  10/28/1945  DATE OF ADMISSION:  03/22/2013  REFERRING PHYSICIAN: Cecille Amsterdam. Mayford Knife, MD  PRIMARY CARE PHYSICIAN: None.  RADIATION ONCOLOGY: Rebeca Alert, MD  CHIEF COMPLAINT: Increased shortness of breath.   HISTORY OF PRESENT ILLNESS: This is a very nice 69 year old gentleman, poor historian, who comes with a history of starting on Monday with increased shortness of breath, getting worse as we speak. Apparently the patient has shortness of breath on a regular basis because of his COPD and emphysema. His lungs are actually very emphysematous on the CT scan. We do not have previous x-rays to compare. The patient definitely has chronic respiratory problems. He states that he was his normal self and increasingly had shortness of breath, mostly whenever he was doing any type of activity, but occasionally it happened when he was just lying down. The patient was starting to have some chest tightness today, had significant wheezing, for what he decided to come into the ER. He is not able to give me any more information or any more details. He again is a very poor historian, and his wife also is very quiet and just sitting there with him. The patient has history of prostate cancer, for what he has undergone radiation therapy. The last note by Dr. Rushie Chestnut in October 2014 shows that his last PSA was 0.4 in April, and he stated that he needed just to repeat the PSA and get some followup in 6 months. He has been told that his prostate cancer was resolved.   The patient was evaluated in the ER. He was found to have an elevation of LFTs with significant pancytopenia, platelets only 30,000, white count of 0.8 with hemoglobin of 6.4. His absolute neutrophil count is 464, which puts him in severe neutropenia category. There are no significant signs of bleeding anywhere. No melena. No hematochezia. The patient had increase in red blood cells and  white blood cells in urine, which could be chronic, but with his wheezing, he is having what seems to be a slight COPD exacerbation. The patient is at high risk of infection, as he is severely pancytopenic. The literature says that prophylactic antibiotics are not necessarily indicated, but we are going to treat him for a COPD exacerbation with Levaquin, which also could be good coverage for the severe pancytopenia that the patient is having. The case was discussed with Dr. Lorre Nick by the PA in the ER, and he recommended transfusion of irradiated blood. He is likely having acute leukemia. His CT scan showed no signs of PE, but significant bullous emphysematous changes and new right lower lobe lung nodules/masses and peribronchial and carinal adenopathy that is concerning for metastatic cancer. The patient is admitted for treatment of his condition and further evaluation.   REVIEW OF SYSTEMS:    CONSTITUTIONAL: Positive fatigue. He denies any fever or weakness. Weight loss actually of 17 pounds in a year.  EYES: No blurry vision or double vision.  EARS, NOSE, THROAT: No tinnitus. No epistaxis.  RESPIRATORY: Positive cough. Positive wheezing. Positive increased secretions. They still clear, but they have increased significantly.  CARDIOVASCULAR: No chest pain, orthopnea, palpitations.  GENITOURINARY: No dysuria, hematuria, changes in urinary frequency. No prostatitis.  GASTROINTESTINAL: No nausea, vomiting, abdominal pain, constipation, diarrhea.  ENDOCRINE: No polyuria, polydipsia, polyphagia, cold or heat intolerance.  SKIN: No rashes or petechiae.  MUSCULOSKELETAL: No significant back pain or joint pain. No gout.  NEUROLOGIC: No numbness, tingling or  CVA.  PSYCHIATRIC: No anxiety or depression.   PAST MEDICAL HISTORY: 1.  Prostate cancer diagnosed 1-1/2 years ago, status post radiation therapy. The patient is in remission.  2.  BPH.  3.  COPD, although the patient was not formally diagnosed.    ALLERGIES: No known drug allergies.   PAST SURGICAL HISTORY: None.   FAMILY HISTORY: Positive MI in his father and his sister. No cancer. CHF in his mother.   SOCIAL HISTORY: He smokes 1 pack a day. Smoking cessation counseling given to the patient for over 5 minutes. The patient states that he is not quite ready to quit, but he would like to do it someday. I offered him help. He is still not ready to take it. He does not drink. He lives with his wife and his son. He does not use drugs.   MEDICATIONS: Aspirin and Flomax.   PHYSICAL EXAMINATION: VITAL SIGNS: Blood pressure 106/70, pulse 75, respirations 18, temperature 97.4. Oxygen saturation 90% on room air, for what he has been put on oxygen 2 liters nasal cannula.  GENERAL: The patient chronically ill-looking, thin, with moderate level of malnutrition in appearance.  HEENT: His pupils are equal and reactive. Extraocular movements are intact. Mucosae are moist. Anicteric sclerae. Pink conjunctivae. No oral lesions. No oropharyngeal exudates.  NECK: Supple. No JVD. No thyromegaly. No adenopathy. No masses.  CARDIOVASCULAR: Regular rate and rhythm. No murmurs, rubs or gallops. No displacement of PMI.  LUNGS: Showing some mild wheezing with rhonchi. There are signs of consolidation with dullness to percussion in the right lower lobe.  ABDOMEN: Soft, nontender, nondistended. No hepatosplenomegaly. No masses. Bowel sounds are positive.  GENITAL: Exam deferred.  EXTREMITIES: No edema, cyanosis or clubbing. Pulses +2. Capillary refill less than 3.  MUSCULOSKELETAL: No significant joint effusions. No significant joint deformity.  SKIN: No rashes or petechiae. Decreased turgor.  LYMPHATIC: Negative for lymphadenopathy in neck or supraclavicular areas.  NEUROLOGIC: Cranial nerves II through XII intact. Strength is 5 out of 5 in 4 extremities.  SKIN: No rashes or petechiae.  PSYCHIATRIC: No significant agitation. He is alert, oriented x  3.  LABORATORY, DIAGNOSTIC AND RADIOLOGICAL DATA: Glucose 144, creatinine 1.27, sodium 135. LDH is 1500, total protein 6.2, albumin 3.1, alkaline phosphatase 255, AST 108. White count is 0.8; absolute neutrophilic count is 464; hemoglobin is 6.4, platelet count 30,000. Manual differential: No description of immature forms. The patient has 3 bands. His antibody screen is positive. Urinalysis: 6 white blood cells, 5 red blood cells, negative nitrite or leukocyte esterase.   CT scan as mentioned above. Chest x-ray: Emphysematous changes, lower lobe nodules on the right side.   EKG: Sinus tachycardia. His pulse was 118 on remission, respirations 22.   ASSESSMENT AND PLAN: This is a very nice 69 year old gentleman with history of prostate cancer in remission, benign prostatic hypertrophy and undiagnosed emphysema, comes with increased shortness of breath.  1.  Shortness of breath: This is likely a combination of a mild chronic obstructive pulmonary disease exacerbation with severe anemia. The patient is on 2 liters nasal cannula after he desaturated to 90% on room air. Treat chronic obstructive pulmonary disease exacerbation. I am not going to start him on steroids, but I am going to start antibiotics and do some nebs. Steroids are not necessarily contraindicated, but his immune system is really decreased, so I do not want to decrease it even further.  2.  Pancytopenia: The patient has significant decrease of hemoglobin, red blood cells and platelets.  This is likely secondary to a leukemoid reaction, but could be also related to a blastic anemia as he has received radiation. The patient is going to need a bone morrow biopsy. We consulted hematology for further recommendations. As far as transfusion, we are going to give him transfusion of irradiated packed red blood cells, 2 units, transfuse him with platelets if the patient has platelets 20,000, acute bleeding or seizures. If he has any changes in his mental  status, get a stat CT scan of the head due to his low platelets. I am not going to start him on Neulasta or any other colony stimulants because they could drop his platelets even more. We are going to wait for hematology/oncology to decide on that.  3.  New lung masses: Likely secondary to lung cancer, as the patient is a smoker. Could this be secondary to metastatic cancer? Yes, as the patient has a history of prostate cancer, although it is very likely to be a primary. Also cannot rule out the possibility of leukemia or lymphoma. Hematology/oncology consulted. The patient is a full code. I am not going to start him on a palliative care consult just yet until we get a final diagnosis, but he will definitely benefit from it. 4.  Chronic obstructive pulmonary disease exacerbation: As mentioned above, he is going to be treated with Levaquin due to the fact that he is at high risk of infections anyway with a decreased ANC below 500. I am not going to start steroids to prevent any further decrease in his immune response, unless he is in significant respiratory distress. At this moment, he is stable. We are going to do nebulizers scheduled.  5.  Prostate cancer, in remission. Repeat PSA.  6.  Gastrointestinal prophylaxis with ranitidine. Avoid proton pump inhibitor, as the patient has significant thrombocytopenia and this is a side effect of the proton pump inhibitors.  7.  Deep vein thrombosis prophylaxis: Sequential compression devices, mechanical measures due to his severe thrombocytopenia.   TIME SPENT: I spent about 50 minutes with this patient. He is a smoker and smoking cessation counseling given for over 5 minutes. The patient states he is not ready to quit smoking    ____________________________ Brent Furnaceoberto Sanchez Gutierrez, MD rsg:jm D: 03/22/2013 18:32:18 ET T: 03/22/2013 19:13:34 ET JOB#: [id number]  cc: Brent Furnaceoberto Sanchez Gutierrez, MD, <Dictator> Anniebell Bedore Juanda ChanceSANCHEZ GUTIERRE MD ELECTRONICALLY SIGNED  04/10/2013 22:58

## 2014-09-12 NOTE — Consult Note (Signed)
Chief Complaint:  Subjective/Chief Complaint NO ACUTE COMPLAINTS  NO PAIN, NOT SOB , IN BED ON 02   VITAL SIGNS/ANCILLARY NOTES: **Vital Signs.:   05-Nov-14 18:43  Vital Signs Type Q 4hr  Temperature Temperature (F) 100.2  Celsius 37.8  Temperature Source oral  Pulse Pulse 95  Respirations Respirations 20  Systolic BP Systolic BP 97  Diastolic BP (mmHg) Diastolic BP (mmHg) 64  Mean BP 75  Pulse Ox % Pulse Ox % 94  Pulse Ox Activity Level  At rest  Oxygen Delivery 2L   Brief Assessment:  GEN thin   Cardiac Regular   Respiratory normal resp effort   Gastrointestinal details normal Nontender   EXTR negative edema   Additional Physical Exam ALERT AND COOPERATIVE NEURO NON FOCAL   Lab Results:  TDMs:  04-Nov-14 11:16   Vancomycin, Trough LAB 10 (Result(s) reported on 26 Mar 2013 at 11:44AM.)  Routine Chem:  04-Nov-14 05:22   Result Comment WBC - RESULTS VERIFIED BY REPEAT TESTING.  - CRITICAL VALUE PREVIOUSLY NOTIFIED. PLATELET - RESULTS VERIFIED BY REPEAT TESTING.  - NOTIFIED OF CRITICAL VALUE  - C/ [place] @0553  03-26-13.AJO  - VERIFIED BY SMEAR ESTIMATE  Result(s) reported on 26 Mar 2013 at 06:55AM.  Glucose, Serum 86  BUN  33  Creatinine (comp) 1.16  Sodium, Serum  133  Potassium, Serum 4.2  Chloride, Serum 102  CO2, Serum 25  Calcium (Total), Serum 9.1  Anion Gap  6  Osmolality (calc) 273  eGFR (African American) >60  eGFR (Non-African American) >60 (eGFR values <75m/min/1.73 m2 may be an indication of chronic kidney disease (CKD). Calculated eGFR is useful in patients with stable renal function. The eGFR calculation will not be reliable in acutely ill patients when serum creatinine is changing rapidly. It is not useful in  patients on dialysis. The eGFR calculation may not be applicable to patients at the low and high extremes of body sizes, pregnant women, and vegetarians.)  05-Nov-14 13:27   Result Comment wbc/platelets - RESULTS VERIFIED  BY REPEAT TESTING.  - NOTIFIED OF CRITICAL VALUE  - Notified Glenda Fields at 1404 on  - 03/27/13...[facility]  - READ-BACK PROCESS PERFORMED.  - DUE TO THE LOW WBC, THE INSTRUMENT DIFF  - CANNOT BE CONFIRMED BY MANUALDIFF AND  - IS REPORTED PRIMARILY TO IDENTIFY CELL  - TYPES PRESENT.  - SMEAR SCANNED  Result(s) reported on 27 Mar 2013 at 03:10PM.  Routine Hem:  04-Nov-14 05:22   WBC (CBC)  0.7  RBC (CBC)  2.82  Hemoglobin (CBC)  8.4  Hematocrit (CBC)  22.9  Platelet Count (CBC)  24  MCV 81  MCH 29.7  MCHC  36.5  RDW  15.1  Neutrophil % 53.9  Lymphocyte % 33.2  Monocyte % 10.7  Eosinophil % 0.9  Basophil % 1.3  Neutrophil #  0.4  Lymphocyte #  0.2  Monocyte #  0.1  Eosinophil # 0.0  Basophil # 0.0  05-Nov-14 13:27   WBC (CBC)  0.5  RBC (CBC)  2.68  Hemoglobin (CBC)  7.9  Hematocrit (CBC)  22.0  Platelet Count (CBC)  13  MCV 82  MCH 29.6  MCHC 36.0  RDW  15.5  Neutrophil % 46.7  Lymphocyte % 38.2  Monocyte % 10.4  Eosinophil % 1.1  Basophil % 3.6  Neutrophil #  0.2  Lymphocyte #  0.2  Monocyte #  0.1  Eosinophil # 0.0  Basophil # 0.0   Assessment/Plan:  Assessment/Plan:  Assessment  LYMPHADENOPATHY, PULMONARY LESIONS, PANCYTOPENIA.  ALSO FEBRILE, ANTIFUNGALS HAVE BEEN ADDED TO ANTIBIOTICS FOR NEUTROPENIC FEVER. NO BLEEDING. EMPHYSEMA  AS PRIOR NOTED ON CT. HIV NEGATIVE, HEP B NEG, HEP C NEG, COOMBS NEG.  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 ALSO SOME SCATTERED LARGE CELLS, RAISE CONCERN OF POSSIBLE TRANSITION.  THERE ARE AREAS OF PRESERVED NORMAL APPEARING HEMATOPOETIC CELLS NO OBVIOUS DYSPLASIA, SOME REDUCTION IN MYELOID SERIES, MARROW INFILTRATED WITH LYMPHOMA.  CURRENTLY VSS, LOW NORMAL BP, ASYMPTOMATIC, FEVER LOWER AT 100.2 BUT HAS SPIKED EACH EVENING.  NO  BLEEDING. PLTS DROPPED DOWN AGAIN BELOW 20,000. WBC SO FAR NO RESPONSE TO 3 DOSES/ STARTED 2 DAYS AGO NEUPOGEN,   Plan CURRENTLY WOULD PLAN TO HOLD NEUPOGEN, AND CHOSE [place], TRANSFUSE PRN, CONTINUE SUPPORTIVE ANTIBIOTICS AND ANTIFUNGAL....Marland KitchenPOTENTIALLY FEVER COULD RESPOND TO STEROIDS AND/OR CHEMOTX, COUNTS TO RECOVER AFTER DELAY. HOWEVER, WITH PANCYTOPENIA, FEVER SO FAR NOT RESPONDING, ANTIFUNGALS ALREADY STARTED, LUNG NODULES POSSIBLY INFECTIOUS VS NEOPLASTIC, PATIENT VERY HIGH RISK. HIS FAMILY HAS ASKED ABOUT TRANSFER TO [facility]. I AGREE WITH OBTAINING SECOND OPINION AT [facility] WITH [facility]MPH[facility], , PLUS [facility],    I ARRANGED TRANSFER WITH DR Thera Flake OF [facility]   Electronic Signatures: Dallas Schimke (MD)  (Signed [phone] 20:21)  Authored: Chief Complaint, VITAL SIGNS/ANCILLARY NOTES, Brief Assessment, Lab Results, Assessment/Plan   Last Updated: 05-Nov-14 20:21 by Dallas Schimke (MD)

## 2014-09-14 NOTE — Consult Note (Signed)
Reason for Visit: This 69 year old Male patient presents to the clinic for initial evaluation of  Prostate cancer .   Referred by Dr. Leonette MonarchHarman.  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 Orson SlickHarmon for rectal dysfunction. His initial PSA was 42.4.he is fairly asymptomatic except for rectal dysfunction. 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 He was started on Flomax after his biopsy and has done well. He is seen today for consideration of treatment. Bone scan has not been performed. He does have some urgency and frequency and nocturia x2.  Past Hx:    Denies medical history:    Denies surgical history.:   Past, Family and Social History:   Past Medical History positive    Family History noncontributory    Social History positive    Social History Comments Greater than 30-pack-year smoking history, no EtOH use history    Additional Past Medical and Surgical History Accompanied by family member today   Allergies:   No Known Allergies:   Home Meds:  Home Medications: Medication Instructions Status  aspirin 325 mg oral tablet 1 tab(s) orally once a day Active   Review of Systems:   General negative    Performance Status (ECOG) 0    Skin negative    Breast negative    Ophthalmologic negative    ENMT negative    Respiratory and Thorax negative    Cardiovascular negative    Gastrointestinal negative    Genitourinary see  HPI    Musculoskeletal negative    Neurological negative    Psychiatric negative    Hematology/Lymphatics negative    Endocrine negative    Allergic/Immunologic negative   Nursing Notes:  Nursing Vital Signs and Chemo Nursing Nursing Notes: *CC Vital Signs Flowsheet:   01-Jul-13 11:33   Pulse Pulse 69   Respirations Respirations 20   SBP SBP 112   DBP DBP 74   Pain Scale (0-10)  0   Current Weight (kg) (kg) 61   Physical Exam:  General/Skin/HEENT:   General normal    Skin normal    Eyes normal    ENMT normal    Head and Neck normal    Additional PE Well-developed thin male in NAD. Lungs are clear to A&P cardiac examination shows regular rate and rhythm. On rectal exam rectal sphincter tone is good. Prostate is indurated and has nodular densities throughout. There is more bulky disease in the left lateral lobe. Sulcus is somewhat obliterated in the left lateral lobe. No other rectal abnormalities identified.   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 I believe his pelvic lymph nodes her risk. I have recommended IMRT radiation therapy. Would deliver up to 8000 cGy to his prostate and also treated his pelvic nodes to 5400 cGy using IMRTdose painting technique. I am obtaining a bone scan on him since he is at high-risk for skin skeletal metastasis would like to rule that out before proceeding to therapy. Should that be for normal we will proceed with gold seed marker placement by Dr. Leonette Monarch to do daily  image guided radiation therapy. Risks and benefits of treatment including increased urinary frequency and urgency as well as possibility of diarrhea, fatigue and further problems with erectile dysfunction were all explained in detail to the patient. I contacted Dr. Cammie Sickle office for gold seed placement and that appointment is pending. Would also like to suppress him at least for your half on Lupron and will arrange to have that done. Have gone ahead and ordered bone scan. We will see the patient shortly after gold seed marker placement for treatment planning should his bone scan be negative.  I would like to take this opportunity to thank you for allowing me to continue to participate in this patient's care.  CC Referral:   cc: Dr. Leonette Monarch   Electronic Signatures: Rushie Chestnut, Gordy Councilman (MD)  (Signed 01-Jul-13 16:30)  Authored: HPI, Diagnosis, Past Hx, PFSH, Allergies, Home Meds, ROS, Nursing Notes, Physical Exam, Encounter Assessment and Plan, CC Referring Physician   Last Updated: 01-Jul-13 16:30 by Rebeca Alert (MD)
